# Patient Record
Sex: Male | Born: 2000 | Race: Black or African American | Hispanic: No | Marital: Single | Smoking: Never smoker
Health system: Southern US, Community
[De-identification: ages and names within clinical notes are randomized; demographics above are authoritative.]

## PROBLEM LIST (undated history)

## (undated) DIAGNOSIS — T7840XA Allergy, unspecified, initial encounter: Secondary | ICD-10-CM

## (undated) DIAGNOSIS — J45909 Unspecified asthma, uncomplicated: Secondary | ICD-10-CM

## (undated) HISTORY — DX: Allergy, unspecified, initial encounter: T78.40XA

## (undated) HISTORY — DX: Unspecified asthma, uncomplicated: J45.909

---
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
## 2022-11-07 ENCOUNTER — Encounter (HOSPITAL_BASED_OUTPATIENT_CLINIC_OR_DEPARTMENT_OTHER): Payer: Medicaid Other | Admitting: Family Medicine

## 2022-11-10 ENCOUNTER — Encounter (HOSPITAL_BASED_OUTPATIENT_CLINIC_OR_DEPARTMENT_OTHER): Payer: Self-pay | Admitting: Family Medicine

## 2022-11-10 ENCOUNTER — Ambulatory Visit (INDEPENDENT_AMBULATORY_CARE_PROVIDER_SITE_OTHER): Payer: Medicaid Other | Admitting: Family Medicine

## 2022-11-10 VITALS — BP 134/70 | HR 94 | Ht 73.0 in | Wt 214.1 lb

## 2022-11-10 DIAGNOSIS — Z0000 Encounter for general adult medical examination without abnormal findings: Secondary | ICD-10-CM | POA: Diagnosis not present

## 2022-11-10 NOTE — Progress Notes (Signed)
Complete physical exam  Patient: Dennis Torres   DOB: [birth year]   22 y.o. Male  MRN: [id number]  Subjective:    Dennis Torres is a 22 y.o. male who presents today for a complete physical exam. He reports consuming a general diet. He generally feels well. He reports sleeping well. He does not have additional problems/concerns to discuss today.   He does not go to the gym frequently due to his job. He works at [employer] and is very physical there.   Depression screenings:    11/10/2022    9:12 AM 11/10/2022    8:47 AM 11/02/2021    8:50 AM  Depression screen PHQ 2/9  Decreased Interest 0 0 0  Down, Depressed, Hopeless 0 0 0  PHQ - 2 Score 0 0 0  Altered sleeping 0    Tired, decreased energy 0    Change in appetite 0    Feeling bad or failure about yourself  0    Trouble concentrating 0    Moving slowly or fidgety/restless 0    Suicidal thoughts 0    PHQ-9 Score 0    Difficult doing work/chores Not difficult at all      Anxiety screenings:    11/10/2022    9:13 AM 01/15/2021    8:57 AM  GAD 7 : Generalized Anxiety Score  Nervous, Anxious, on Edge 0 0  Control/stop worrying 0 0  Worry too much - different things 0 0  Trouble relaxing 0 0  Restless 0 0  Easily annoyed or irritable 0 0  Afraid - awful might happen 0 0  Total GAD 7 Score 0 0  Anxiety Difficulty Not difficult at all Not difficult at all   Dentist: scheduled for this month, sees orthodontist  Vision: discussed scheduling appt   Patient Care Team: [place], Blondell Reveal, MD as PCP - General (Family Medicine)   No outpatient medications prior to visit.   No facility-administered medications prior to visit.    Review of Systems  Constitutional:  Negative for malaise/fatigue.  Respiratory:  Negative for cough and shortness of breath.   Cardiovascular:  Negative for chest pain and palpitations.  Gastrointestinal:  Negative for abdominal pain, nausea and vomiting.  Musculoskeletal:  Negative for myalgias.   Neurological:  Negative for dizziness, weakness and headaches.  Psychiatric/Behavioral:  Negative for depression and suicidal ideas.       Objective:    BP 134/70   Pulse 94   Ht 6\' 1"  (1.854 m)   Wt 214 lb 1.6 oz (97.1 kg)   SpO2 99%   BMI 28.25 kg/m  BP Readings from Last 3 Encounters:  11/10/22 134/70  11/02/21 108/60  06/02/21 118/72     Physical Exam Constitutional:      Appearance: Normal appearance.  HENT:     Right Ear: Tympanic membrane, ear canal and external ear normal.     Left Ear: Tympanic membrane, ear canal and external ear normal.     Mouth/Throat:     Mouth: Mucous membranes are moist.  Eyes:     Extraocular Movements: Extraocular movements intact.     Pupils: Pupils are equal, round, and reactive to light.  Cardiovascular:     Rate and Rhythm: Normal rate and regular rhythm.     Pulses: Normal pulses.     Heart sounds: Normal heart sounds.  Pulmonary:     Effort: Pulmonary effort is normal.     Breath sounds: Normal breath sounds.  Abdominal:  General: Abdomen is flat. Bowel sounds are normal.     Palpations: Abdomen is soft.  Musculoskeletal:        General: Normal range of motion.  Skin:    General: Skin is warm and dry.  Neurological:     Mental Status: He is alert.  Psychiatric:        Attention and Perception: Attention normal.        Mood and Affect: Mood normal.        Behavior: Behavior normal.        Thought Content: Thought content normal.        Judgment: Judgment normal.       Assessment & Plan:    Routine Health Maintenance and Physical Exam  Health Maintenance  Topic Date Due   HIV Screening  Never done   Hepatitis C Screening: USPSTF Recommendation to screen - Ages 64-79 yo.  Never done   COVID-19 Vaccine (3 - 2023-24 season) 08/07/2024*   Flu Shot  03/09/2023   DTaP/Tdap/Td vaccine (7 - Td or Tdap) 03/01/2025   HPV Vaccine  Completed  *Topic was postponed. The date shown is not the original due date.   1.  Wellness examination Routine labs reviewed from previous year and discussed obtaining today. Discussed options of obtaining labs today or skipping this year. Patient would like to skip this year.  Review of PMH, FH, SH, medications and HM performed.  Recommend healthy diet.  Recommend approximately 150 minutes/week of moderate intensity exercise. Recommend regular dental and vision exams. Always use seatbelt/lap and shoulder restraints. Recommend using smoke alarms and checking batteries at least twice a year. Recommend using sunscreen when outside. Discussed immunization recommendations. Vaccines are UTD.   Return in about 1 year (around 11/10/2023) for Physical with fasting labs.     Les Pou, FNP

## 2023-06-29 ENCOUNTER — Encounter (HOSPITAL_BASED_OUTPATIENT_CLINIC_OR_DEPARTMENT_OTHER): Payer: Self-pay | Admitting: Family Medicine

## 2023-11-09 ENCOUNTER — Encounter (HOSPITAL_BASED_OUTPATIENT_CLINIC_OR_DEPARTMENT_OTHER): Payer: Self-pay | Admitting: *Deleted

## 2023-11-10 ENCOUNTER — Encounter (HOSPITAL_BASED_OUTPATIENT_CLINIC_OR_DEPARTMENT_OTHER): Payer: Medicaid Other | Admitting: Family Medicine

## 2023-11-17 ENCOUNTER — Encounter (HOSPITAL_BASED_OUTPATIENT_CLINIC_OR_DEPARTMENT_OTHER): Payer: Self-pay | Admitting: *Deleted

## 2023-11-17 ENCOUNTER — Encounter (HOSPITAL_BASED_OUTPATIENT_CLINIC_OR_DEPARTMENT_OTHER): Payer: Self-pay | Admitting: Family Medicine

## 2023-11-17 ENCOUNTER — Ambulatory Visit (INDEPENDENT_AMBULATORY_CARE_PROVIDER_SITE_OTHER): Admitting: Family Medicine

## 2023-11-17 VITALS — BP 117/69 | HR 59 | Ht 73.0 in | Wt 202.0 lb

## 2023-11-17 DIAGNOSIS — Z0000 Encounter for general adult medical examination without abnormal findings: Secondary | ICD-10-CM | POA: Diagnosis not present

## 2023-11-17 NOTE — Assessment & Plan Note (Addendum)

## 2023-11-17 NOTE — Progress Notes (Signed)
 Subjective:    CC: Annual Physical Exam  HPI:  Dennis Torres is a 23 y.o. presenting for annual physical  I reviewed the past medical history, family history, social history, surgical history, and allergies today and no changes were needed.  Please see the problem list section below in epic for further details.  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 No pertinent surgical history. Social History: Social History   Socioeconomic History   Marital status: Single    Spouse name: Not on file   Number of children: Not on file   Years of education: Not on file   Highest education level: Not on file  Occupational History   Not on file  Tobacco Use   Smoking status: Never   Smokeless tobacco: Never  Vaping Use   Vaping status: Never Used  Substance and Sexual Activity   Alcohol use: No   Drug use: No   Sexual activity: Not Currently  Other Topics Concern   Not on file  Social History Narrative   Not on file   Social Drivers of Health   Financial Resource Strain: Low Risk  (11/02/2021)   Overall Financial Resource Strain (CARDIA)    Difficulty of Paying Living Expenses: Not hard at all  Food Insecurity: No Food Insecurity (11/02/2021)   Hunger Vital Sign    Worried About Running Out of Food in the Last Year: Never true    Ran Out of Food in the Last Year: Never true  Transportation Needs: No Transportation Needs (11/02/2021)   PRAPARE - Administrator, Civil Service (Medical): No    Lack of Transportation (Non-Medical): No  Physical Activity: Sufficiently Active (11/02/2021)   Exercise Vital Sign    Days of Exercise per Week: 6 days    Minutes of Exercise per Session: 60 min  Stress: No Stress Concern Present (11/02/2021)   [employer] of Occupational Health -  Occupational Stress Questionnaire    Feeling of Stress : Not at all  Social Connections: Socially Isolated (11/02/2021)   Social Connection and Isolation Panel [NHANES]    Frequency of Communication with Friends and Family: More than three times a week    Frequency of Social Gatherings with Friends and Family: More than three times a week    Attends Religious Services: Never    Database administrator or Organizations: No    Attends Engineer, structural: Never    Marital Status: Never married   Family History: Family History  Problem Relation Age of Onset   Hypertension Maternal Grandmother    Allergies: No Known Allergies Medications: See med rec.  Review of Systems: No headache, visual changes, nausea, vomiting, diarrhea, constipation, dizziness, abdominal pain, skin rash, fevers, chills, night sweats, swollen lymph nodes, weight loss, chest pain, body aches, joint swelling, muscle aches, shortness of breath, mood changes, visual or auditory hallucinations.  Objective:    BP 117/69 (BP Location: Right Arm, Patient Position: Sitting, Cuff Size: Large)   Pulse (!) 59   Ht 6\' 1"  (1.854 m)   Wt 202 lb (91.6 kg)   SpO2 100%   BMI 26.65 kg/m   General: Well Developed, well nourished, and in no acute distress.  Neuro: Alert and oriented x3, extra-ocular muscles intact, sensation grossly intact. Cranial  nerves II through XII are intact, motor, sensory, and coordinative functions are all intact. HEENT: Normocephalic, atraumatic, pupils equal round reactive to light, neck supple, no masses, no lymphadenopathy, thyroid nonpalpable. Oropharynx, nasopharynx, external ear canals are unremarkable. Skin: Warm and dry, no rashes noted.  Cardiac: Regular rate and rhythm, no murmurs rubs or gallops.  Respiratory: Clear to auscultation bilaterally. Not using accessory muscles, speaking in full sentences.  Abdominal: Soft, nontender, nondistended, positive bowel sounds, no masses, no  organomegaly.  Musculoskeletal: Shoulder, elbow, wrist, hip, knee, ankle stable, and with full range of motion.  Impression and Recommendations:    Wellness examination Assessment & Plan: Routine HCM labs ordered. HCM reviewed/discussed. Anticipatory guidance regarding healthy weight, lifestyle and choices given. Recommend healthy diet.  Recommend approximately 150 minutes/week of moderate intensity exercise Recommend regular dental and vision exams Always use seatbelt/lap and shoulder restraints Recommend using smoke alarms and checking batteries at least twice a year Recommend using sunscreen when outside Discussed immunization recommendations  Orders: -     CBC with Differential/Platelet -     Comprehensive metabolic panel with GFR -     TSH Rfx on Abnormal to Free T4 -     Lipid panel  Return in about 1 year (around 11/16/2024) for CPE.   ___________________________________________ Temperance Kelemen de [place], MD, ABFM, CAQSM Primary Care and Sports Medicine [facility]

## 2023-11-17 NOTE — Patient Instructions (Signed)
  Medication Instructions:  Your physician recommends that you continue on your current medications as directed. Please refer to the Current Medication list given to you today. --If you need a refill on any your medications before your next appointment, please call your pharmacy first. If no refills are authorized on file call the office.-- Lab Work: Your physician has recommended that you have lab work today: yes If you have labs (blood work) drawn today and your tests are completely normal, you will receive your results via MyChart message OR a phone call from our staff.  Please ensure you check your voicemail in the event that you authorized detailed messages to be left on a delegated number. If you have any lab test that is abnormal or we need to change your treatment, we will call you to review the results.  Referrals/Procedures/Imaging: no  Follow-Up: Your next appointment:   Your physician recommends that you schedule a follow-up appointment in: 1 year  with Dr. de [place]  You will receive a text message or e-mail with a link to a survey about your care and experience with [place] today! We would greatly appreciate your feedback!   Thanks for letting us be apart of your health journey!!  Primary Care and Sports Medicine   Dr. Ceasar Mons [place]   We encourage you to activate your patient portal called "MyChart".  Sign up information is provided on this After Visit Summary.  MyChart is used to connect with patients for Virtual Visits (Telemedicine).  Patients are able to view lab/test results, encounter notes, upcoming appointments, etc.  Non-urgent messages can be sent to your provider as well. To learn more about what you can do with MyChart, please visit --  [email or web address].

## 2023-11-18 LAB — CBC WITH DIFFERENTIAL/PLATELET
Basophils Absolute: 0 10*3/uL (ref 0.0–0.2)
Basos: 0 %
EOS (ABSOLUTE): 0.6 10*3/uL — ABNORMAL HIGH (ref 0.0–0.4)
Eos: 11 %
Hematocrit: 42.6 % (ref 37.5–51.0)
Hemoglobin: 13.6 g/dL (ref 13.0–17.7)
Immature Grans (Abs): 0 10*3/uL (ref 0.0–0.1)
Immature Granulocytes: 0 %
Lymphocytes Absolute: 1.8 10*3/uL (ref 0.7–3.1)
Lymphs: 30 %
MCH: 32.3 pg (ref 26.6–33.0)
MCHC: 31.9 g/dL (ref 31.5–35.7)
MCV: 101 fL — ABNORMAL HIGH (ref 79–97)
Monocytes Absolute: 0.5 10*3/uL (ref 0.1–0.9)
Monocytes: 9 %
Neutrophils Absolute: 3 10*3/uL (ref 1.4–7.0)
Neutrophils: 50 %
Platelets: 244 10*3/uL (ref 150–450)
RBC: 4.21 x10E6/uL (ref 4.14–5.80)
RDW: 11.6 % (ref 11.6–15.4)
WBC: 6 10*3/uL (ref 3.4–10.8)

## 2023-11-18 LAB — LIPID PANEL
Chol/HDL Ratio: 2.2 ratio (ref 0.0–5.0)
Cholesterol, Total: 128 mg/dL (ref 100–199)
HDL: 59 mg/dL (ref >39)
LDL Chol Calc (NIH): 57 mg/dL (ref 0–99)
Triglycerides: 52 mg/dL (ref 0–149)
VLDL Cholesterol Cal: 12 mg/dL (ref 5–40)

## 2023-11-18 LAB — COMPREHENSIVE METABOLIC PANEL WITH GFR
ALT: 29 IU/L (ref 0–44)
AST: 27 IU/L (ref 0–40)
Albumin: 4.7 g/dL (ref 4.3–5.2)
Alkaline Phosphatase: 51 IU/L (ref 44–121)
BUN/Creatinine Ratio: 15 (ref 9–20)
BUN: 16 mg/dL (ref 6–20)
Bilirubin Total: 1.2 mg/dL (ref 0.0–1.2)
CO2: 21 mmol/L (ref 20–29)
Calcium: 9.9 mg/dL (ref 8.7–10.2)
Chloride: 101 mmol/L (ref 96–106)
Creatinine, Ser: 1.09 mg/dL (ref 0.76–1.27)
Globulin, Total: 2.5 g/dL (ref 1.5–4.5)
Glucose: 72 mg/dL (ref 70–99)
Potassium: 4.6 mmol/L (ref 3.5–5.2)
Sodium: 143 mmol/L (ref 134–144)
Total Protein: 7.2 g/dL (ref 6.0–8.5)
eGFR: 99 mL/min/{1.73_m2} (ref >59)

## 2023-11-18 LAB — TSH RFX ON ABNORMAL TO FREE T4: TSH: 1.2 u[IU]/mL (ref 0.450–4.500)

## 2023-11-20 ENCOUNTER — Encounter (HOSPITAL_BASED_OUTPATIENT_CLINIC_OR_DEPARTMENT_OTHER): Payer: Self-pay | Admitting: Family Medicine

## 2024-11-18 ENCOUNTER — Encounter (HOSPITAL_BASED_OUTPATIENT_CLINIC_OR_DEPARTMENT_OTHER): Admitting: Family Medicine
